# Patient Record
Sex: Female | Born: 1995 | Race: White | Hispanic: No | Marital: Single | Smoking: Never smoker
Health system: Southern US, Community
[De-identification: ages and names within clinical notes are randomized; demographics above are authoritative.]

---

## 2017-09-17 ENCOUNTER — Other Ambulatory Visit: Payer: Self-pay

## 2017-09-17 ENCOUNTER — Emergency Department (HOSPITAL_COMMUNITY): Payer: BLUE CROSS/BLUE SHIELD

## 2017-09-17 ENCOUNTER — Encounter (HOSPITAL_COMMUNITY): Payer: Self-pay | Admitting: Emergency Medicine

## 2017-09-17 ENCOUNTER — Emergency Department (HOSPITAL_COMMUNITY)
Admission: EM | Admit: 2017-09-17 | Discharge: 2017-09-17 | Disposition: A | Discharge status: Home / Self Care | Payer: BLUE CROSS/BLUE SHIELD | Attending: Emergency Medicine | Admitting: Emergency Medicine

## 2017-09-17 DIAGNOSIS — R109 Unspecified abdominal pain: Secondary | ICD-10-CM | POA: Diagnosis present

## 2017-09-17 DIAGNOSIS — N10 Acute tubulo-interstitial nephritis: Secondary | ICD-10-CM | POA: Insufficient documentation

## 2017-09-17 DIAGNOSIS — N12 Tubulo-interstitial nephritis, not specified as acute or chronic: Secondary | ICD-10-CM

## 2017-09-17 LAB — COMPREHENSIVE METABOLIC PANEL
ALK PHOS: 58 U/L (ref 38–126)
ALT: 15 U/L (ref 14–54)
AST: 21 U/L (ref 15–41)
Albumin: 4.1 g/dL (ref 3.5–5.0)
Anion gap: 10 (ref 5–15)
BILIRUBIN TOTAL: 0.5 mg/dL (ref 0.3–1.2)
BUN: 9 mg/dL (ref 6–20)
CALCIUM: 9.3 mg/dL (ref 8.9–10.3)
CO2: 23 mmol/L (ref 22–32)
CREATININE: 0.89 mg/dL (ref 0.44–1.00)
Chloride: 106 mmol/L (ref 101–111)
GFR calc Af Amer: >60 mL/min (ref >60)
GFR calc non Af Amer: >60 mL/min (ref >60)
GLUCOSE: 118 mg/dL — AB (ref 65–99)
Potassium: 3.9 mmol/L (ref 3.5–5.1)
SODIUM: 139 mmol/L (ref 135–145)
TOTAL PROTEIN: 6.8 g/dL (ref 6.5–8.1)

## 2017-09-17 LAB — CBC WITH DIFFERENTIAL/PLATELET
BASOS PCT: 0 %
Basophils Absolute: 0 10*3/uL (ref 0.0–0.1)
EOS ABS: 0.2 10*3/uL (ref 0.0–0.7)
EOS PCT: 1 %
HCT: 39.8 % (ref 36.0–46.0)
Hemoglobin: 13.3 g/dL (ref 12.0–15.0)
Lymphocytes Relative: 22 %
Lymphs Abs: 3 10*3/uL (ref 0.7–4.0)
MCH: 31.6 pg (ref 26.0–34.0)
MCHC: 33.4 g/dL (ref 30.0–36.0)
MCV: 94.5 fL (ref 78.0–100.0)
MONO ABS: 1.6 10*3/uL — AB (ref 0.1–1.0)
MONOS PCT: 12 %
Neutro Abs: 8.6 10*3/uL — ABNORMAL HIGH (ref 1.7–7.7)
Neutrophils Relative %: 65 %
PLATELETS: 359 10*3/uL (ref 150–400)
RBC: 4.21 MIL/uL (ref 3.87–5.11)
RDW: 12.5 % (ref 11.5–15.5)
WBC: 13.4 10*3/uL — ABNORMAL HIGH (ref 4.0–10.5)

## 2017-09-17 LAB — URINALYSIS, ROUTINE W REFLEX MICROSCOPIC
BACTERIA UA: NONE SEEN
Bilirubin Urine: NEGATIVE
Glucose, UA: NEGATIVE mg/dL
Ketones, ur: NEGATIVE mg/dL
NITRITE: POSITIVE — AB
PH: 6 (ref 5.0–8.0)
PROTEIN: 100 mg/dL — AB
SPECIFIC GRAVITY, URINE: 1.025 (ref 1.005–1.030)
Squamous Epithelial / LPF: NONE SEEN

## 2017-09-17 LAB — I-STAT BETA HCG BLOOD, ED (MC, WL, AP ONLY): I-stat hCG, quantitative: <5 m[IU]/mL (ref <5)

## 2017-09-17 MED ORDER — SODIUM CHLORIDE 0.9 % IV BOLUS
1000.0000 mL | Freq: Once | INTRAVENOUS | Status: AC
  Administered 2017-09-17: 1000 mL via INTRAVENOUS

## 2017-09-17 MED ORDER — SODIUM CHLORIDE 0.9 % IV SOLN
1.0000 g | Freq: Once | INTRAVENOUS | Status: AC
  Administered 2017-09-17: 1 g via INTRAVENOUS
  Filled 2017-09-17: qty 10

## 2017-09-17 NOTE — ED Notes (Signed)
Patient transported to CT 

## 2017-09-17 NOTE — Discharge Instructions (Addendum)
°  Please take all of your antibiotics until finished!   You may develop abdominal discomfort or diarrhea from the antibiotic.  You may help offset this with probiotics which you can buy or get in yogurt. Do not eat or take the probiotics until 2 hours after your antibiotic.   Please stay well hydrated by drinking plenty of water.  Have a goal of at least half a liter of water an hour.  May take ibuprofen, naproxen, or Tylenol for pain.  Follow-up with the urologist for recurrent UTIs.  Return to the ED for persistent vomiting, fever that does not respond to medications, worsening pain, generally feeling unwell, or any other major concerns.  Follow-up with the podiatrist regarding your foot infection.

## 2017-09-17 NOTE — ED Provider Notes (Signed)
[facility] EMERGENCY DEPARTMENT Provider Note   CSN: [id number] Arrival date & time: 09/17/17  1940     History   Chief Complaint Chief Complaint  Patient presents with  . Urinary Tract Infection    HPI Jacqueline Elliott is a 22 y.o. female.  HPI   Jacqueline Elliott is a 22 y.o. female, with a history of recurrent UTIs, presenting to the ED with dysuria and right flank.  States she began to have dysuria a couple days ago.  She was seen at an urgent care yesterday, told she had a UTI, and prescribed Cipro 500 mg twice daily for 7 days. Last night she began to have right lower back and flank pain, sharp, 10/10, nonradiating.  Accompanied by fever.   She did note some soft stool following the dose of Cipro last night.  She also notes she was treated for a right big toe infection in [place], where she is from, with two dose of an oral antibiotic called Zinnat (Cefuroxime).  Her last dose of this medication was 2 weeks ago.  Although patient is from [place], she attends [employer].  Denies nausea, vomiting, hematuria, abnormal vaginal discharge or bleeding, or any other complaints.    History reviewed. No pertinent past medical history.  There are no active problems to display for this patient.   History reviewed. No pertinent surgical history.   OB History   None      Home Medications    Prior to Admission medications   Not on File    Family History No family history on file.  Social History Social History   Tobacco Use  . Smoking status: Not on file  Substance Use Topics  . Alcohol use: Not on file  . Drug use: Not on file     Allergies   Penicillins   Review of Systems Review of Systems  Constitutional: Positive for fever.  Respiratory: Negative for shortness of breath.   Cardiovascular: Negative for chest pain.  Gastrointestinal: Negative for blood in stool, nausea and vomiting.  Genitourinary: Positive for dysuria and flank  pain. Negative for hematuria, vaginal bleeding and vaginal discharge.  Musculoskeletal: Positive for back pain.  All other systems reviewed and are negative.    Physical Exam Updated Vital Signs BP 125/88 (BP Location: Right Arm)   Pulse (!) 107   Temp 98.2 F (36.8 C) (Oral)   Resp 16   Ht 5\' 5"  (1.651 m)   Wt 61.2 kg (135 lb)   LMP 09/04/2017 (Approximate)   SpO2 98%   BMI 22.47 kg/m   Physical Exam  Constitutional: She appears well-developed and well-nourished. No distress.  HENT:  Head: Normocephalic and atraumatic.  Eyes: Conjunctivae are normal.  Neck: Neck supple.  Cardiovascular: Normal rate, regular rhythm, normal heart sounds and intact distal pulses.  Pulmonary/Chest: Effort normal and breath sounds normal. No respiratory distress.  Abdominal: Soft. There is no tenderness. There is no guarding and no CVA tenderness.  Musculoskeletal: She exhibits no edema.  Lymphadenopathy:    She has no cervical adenopathy.  Neurological: She is alert.  Skin: Skin is warm and dry. She is not diaphoretic.  Psychiatric: She has a normal mood and affect. Her behavior is normal.  Nursing note and vitals reviewed.    ED Treatments / Results  Labs (all labs ordered are listed, but only abnormal results are displayed) Labs Reviewed  COMPREHENSIVE METABOLIC PANEL - Abnormal; Notable for the following components:  Result Value   Glucose, Bld 118 (*)    All other components within normal limits  CBC WITH DIFFERENTIAL/PLATELET - Abnormal; Notable for the following components:   WBC 13.4 (*)    Neutro Abs 8.6 (*)    Monocytes Absolute 1.6 (*)    All other components within normal limits  URINALYSIS, ROUTINE W REFLEX MICROSCOPIC - Abnormal; Notable for the following components:   APPearance CLOUDY (*)    Hgb urine dipstick LARGE (*)    Protein, ur 100 (*)    Nitrite POSITIVE (*)    Leukocytes, UA LARGE (*)    All other components within normal limits  URINE CULTURE    I-STAT BETA HCG BLOOD, ED ([facility], [facility], [facility] ONLY)    EKG None  Radiology Ct Renal Stone Study  Result Date: 09/17/2017 CLINICAL DATA:  Sharp right flank pain today. Urinary tract infection symptoms since last Tuesday. EXAM: CT ABDOMEN AND PELVIS WITHOUT CONTRAST TECHNIQUE: Multidetector CT imaging of the abdomen and pelvis was performed following the standard protocol without IV contrast. COMPARISON:  None. FINDINGS: Lower chest: No acute abnormality. Hepatobiliary: No focal liver abnormality is seen. No gallstones, gallbladder wall thickening, or biliary dilatation. Pancreas: Unremarkable. No pancreatic ductal dilatation or surrounding inflammatory changes. Spleen: Normal in size without focal abnormality. Adrenals/Urinary Tract: The adrenal glands are normal. There is right hydroureteronephrosis without focal discrete obstructing stone noted. No renal stones are identified bilaterally. There is no left hydronephrosis. The bladder is normal. Stomach/Bowel: Stomach is within normal limits. Appendix appears normal. No evidence of bowel wall thickening, distention, or inflammatory changes. Vascular/Lymphatic: No significant vascular findings are present. No enlarged abdominal or pelvic lymph nodes. Reproductive: Uterus and bilateral adnexa are unremarkable. Other: None. Musculoskeletal: No acute or significant osseous findings. IMPRESSION: Right hydroureteronephrosis without focal discrete obstructing stone noted. The findings can be seen in pyelonephritis or recently passed stone. Normal appendix. Electronically Signed   By: Sherian Rein M.D.   On: 09/17/2017 22:17    Procedures Procedures (including critical care time)  Medications Ordered in ED Medications  sodium chloride 0.9 % bolus 1,000 mL (0 mLs Intravenous Stopped 09/17/17 2232)  cefTRIAXone (ROCEPHIN) 1 g in sodium chloride 0.9 % 100 mL IVPB (0 g Intravenous Stopped 09/17/17 2220)     Initial Impression / Assessment and Plan / ED Course  I  have reviewed the triage vital signs and the nursing notes.  Pertinent labs & imaging results that were available during my care of the patient were reviewed by me and considered in my medical decision making (see chart for details).  Clinical Course as of Sep 17 2241  Wynelle Link Sep 17, 2017  2226 Discussed imaging results with the patient.  Patient continues to be nontoxic-appearing.  No complaints of pain.   [SJ]    Clinical Course User Index [SJ] Corban Kistler C, PA-C    Patient presents with dysuria and right flank pain.  Patient is nontoxic appearing, afebrile, not tachycardic on my exam, not tachypneic, not hypotensive, and is in no apparent distress.   Pyelonephritis is high on the differential.  UA results are also consistent with pyelonephritis.  Due to sudden onset of symptoms, however, rule out of obstructing stone with infection is indicated. Patient remained comfortable throughout ED course.  Tolerating PO.  No evidence of renal stone on CT.  Patient's course of Cipro that was prescribed yesterday is an appropriate choice for pyelonephritis therefore no additional antibiotic is needed at this time.  Referral to urology  due to recurrent UTIs.  The patient was given instructions for home care as well as return precautions. Patient voices understanding of these instructions, accepts the plan, and is comfortable with discharge.  Vitals:   09/17/17 1945 09/17/17 1957 09/17/17 2235  BP: 125/88  125/89  Pulse: (!) 107  90  Resp: 16  16  Temp: 98.2 F (36.8 C)  98.5 F (36.9 C)  TempSrc: Oral  Oral  SpO2: 98%  99%  Weight: 61.2 kg (135 lb) 61.2 kg (135 lb)   Height: 5\' 5"  (1.651 m) 5\' 5"  (1.651 m)      Final Clinical Impressions(s) / ED Diagnoses   Final diagnoses:  Pyelonephritis    ED Discharge Orders    None       Concepcion Living 09/17/17 2243    Tilden Fossa, MD 09/18/17 1203

## 2017-09-17 NOTE — ED Notes (Signed)
ED Provider at bedside. 

## 2017-09-17 NOTE — ED Triage Notes (Signed)
Pt went to the Dr. Today for symptoms of a UTI (frequently has UTIs) and was given antibiotics.  Pt went home and took a nap, woke up w/ "stabbing" flank pain and a fever.  Pt reports taking tylenol 4 hours ago.

## 2017-09-18 ENCOUNTER — Ambulatory Visit: Payer: BLUE CROSS/BLUE SHIELD | Admitting: Podiatry

## 2017-09-18 ENCOUNTER — Encounter: Payer: Self-pay | Admitting: Podiatry

## 2017-09-18 DIAGNOSIS — L600 Ingrowing nail: Secondary | ICD-10-CM | POA: Diagnosis not present

## 2017-09-18 DIAGNOSIS — L03032 Cellulitis of left toe: Secondary | ICD-10-CM

## 2017-09-18 NOTE — Patient Instructions (Signed)

## 2017-09-18 NOTE — Progress Notes (Signed)
This patient presents to the office with chief complaint of a painful infected ingrown toenail left big toe.  Patient states it has been ingrown and infected for 2 months.  This patient comes from [place].  She says when she was home. She took an antibiotic named zinnat for the infection in her left great toe.  She says that was not helping her nail.  2 days ago. She went to the emergency room and was diagnosed with a UTI.  She was prescribed Cipro which she has been taking now for 2 days.  She says the medical doctor told her that would help her infected ingrown toenail.  She presents the office today stating that she has a painful infected toenail and desires an evaluation and treatment.   General Appearance  Alert, conversant and in no acute stress.  Vascular  Dorsalis pedis and posterior tibial  pulses are palpable  bilaterally.  Capillary return is within normal limits  bilaterally. Temperature is within normal limits  bilaterally.  Neurologic  Senn-Weinstein monofilament wire test within normal limits  bilaterally. Muscle power within normal limits bilaterally.  Nails marked incurvation noted along the lateral border of the left great toenail.  There is redness, swelling and pain with granulation tissue noted along the lateral border.  Orthopedic  No limitations of motion of motion feet .  No crepitus or effusions noted.  No bony pathology or digital deformities noted.  Skin  normotropic skin with no porokeratosis noted bilaterally.  No signs of infections or ulcers noted.   Ingrown toenail left hallux.  Paronychia left hallux.   IE  Nail surgery.  Treatment options and alternatives discussed.  Recommended permanent phenol matrixectomy and patient agreed. The left hallux was prepped with alcohol and a toe block of 3cc of 2% lidocaine plain was administered in a digital toe block. .  The toe was then prepped with betadine solution .  The offending nail border was then excised and matrix tissue  exposed.  Phenol was then applied to the matrix tissue followed by an alcohol wash.  Antibiotic ointment and a dry sterile dressing was applied.  The patient was dispensed instructions for aftercare. I had considered ordering cephalexin for this patient.  She was found to be allergic to penicillin.  Therefore, I told her to take the antibiotic Cipro which she is taking for her UTI for her infected great toe.  Home soaks were prescribed for this patient.  Patient to return to the office in one week for further evaluation and treatment     Jacqueline GuntherGregory Chaniyah Elliott DPM

## 2017-09-19 LAB — URINE CULTURE

## 2017-09-21 ENCOUNTER — Ambulatory Visit: Payer: BLUE CROSS/BLUE SHIELD | Admitting: Podiatry

## 2017-09-28 ENCOUNTER — Ambulatory Visit (INDEPENDENT_AMBULATORY_CARE_PROVIDER_SITE_OTHER): Payer: BLUE CROSS/BLUE SHIELD | Admitting: Podiatry

## 2017-09-28 ENCOUNTER — Encounter: Payer: Self-pay | Admitting: Podiatry

## 2017-09-28 DIAGNOSIS — Z09 Encounter for follow-up examination after completed treatment for conditions other than malignant neoplasm: Secondary | ICD-10-CM

## 2017-09-28 NOTE — Progress Notes (Signed)
This patient returns to the office following nail surgery one week ago.  The patient says toe has been soaked and bandaged as directed.  There has been improvement of the toe since the surgery has been performed. The patient presents for continued evaluation and treatment.  GENERAL APPEARANCE: Alert, conversant. Appropriately groomed. No acute distress.  VASCULAR: Pedal pulses palpable at  DP and PT bilateral.  Capillary refill time is immediate to all digits,  Normal temperature gradient.    NEUROLOGIC: sensation is normal to 5.07 monofilament at 5/5 sites bilateral.  Light touch is intact bilateral, Muscle strength normal.  MUSCULOSKELETAL: acceptable muscle strength, tone and stability bilateral.  Intrinsic muscluature intact bilateral.  Rectus appearance of foot and digits noted bilateral.   DERMATOLOGIC: skin color, texture, and turgor are within normal limits.  No preulcerative lesions or ulcers  are seen, no interdigital maceration noted.   NAILS  There is necrotic tissue along the nail groove  In the absence of redness swelling and pain.  DX  S/p nail surgery  ROV  Home instructions were discussed.  Patient to call the office if there are any questions or concerns.   Lynnetta Tom DPM   

## 2017-10-19 ENCOUNTER — Ambulatory Visit: Admit: 2017-10-19 | Payer: BLUE CROSS/BLUE SHIELD

## 2017-10-24 ENCOUNTER — Ambulatory Visit
Admission: RE | Admit: 2017-10-24 | Discharge: 2017-10-24 | Disposition: A | Discharge status: Home / Self Care | Payer: BLUE CROSS/BLUE SHIELD | Source: Ambulatory Visit | Attending: Family Medicine | Admitting: Family Medicine

## 2017-10-24 ENCOUNTER — Other Ambulatory Visit: Payer: Self-pay | Admitting: Family Medicine

## 2017-10-24 DIAGNOSIS — Z7184 Encounter for health counseling related to travel: Secondary | ICD-10-CM

## 2017-10-24 DIAGNOSIS — Z0289 Encounter for other administrative examinations: Secondary | ICD-10-CM | POA: Insufficient documentation

## 2018-08-19 IMAGING — CT CT RENAL STONE PROTOCOL
2 of 4 series · 16 of 46 positions shown, 18 images · non-contrast
Comparison: None.

CLINICAL DATA: Sharp right flank pain today. Urinary tract
infection symptoms since last [REDACTED].

EXAM:
CT ABDOMEN AND PELVIS WITHOUT CONTRAST
TECHNIQUE: Multidetector CT imaging of the abdomen and pelvis was performed
following the standard protocol without IV contrast.

[Series 3: stone study 5.0 i30f 2 · axial · 0.79mm/px · z∈[-391,+29]mm · 13 of 92 slices shown, 15 images]
[im 4/92  soft-tissue]
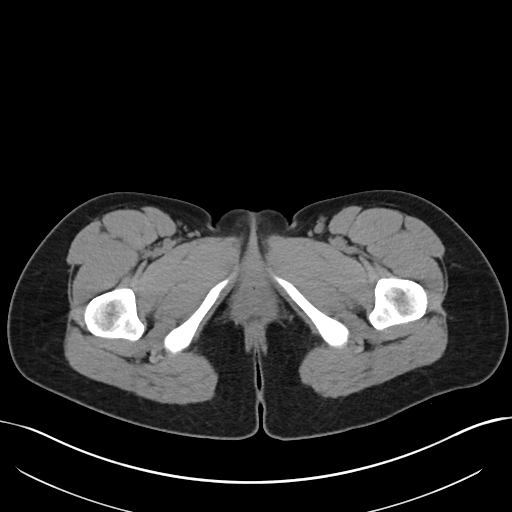
[im 4/92  bone]
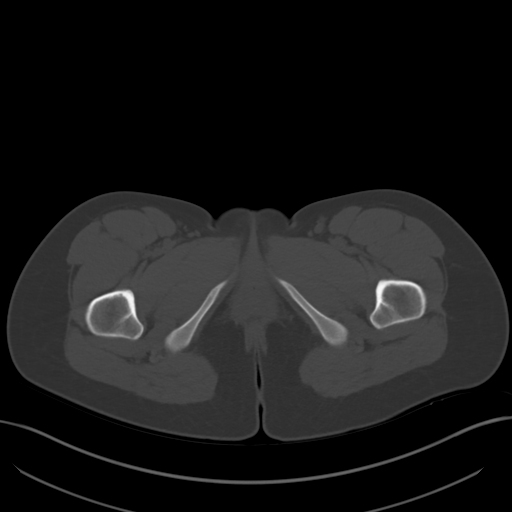
[im 11/92  soft-tissue]
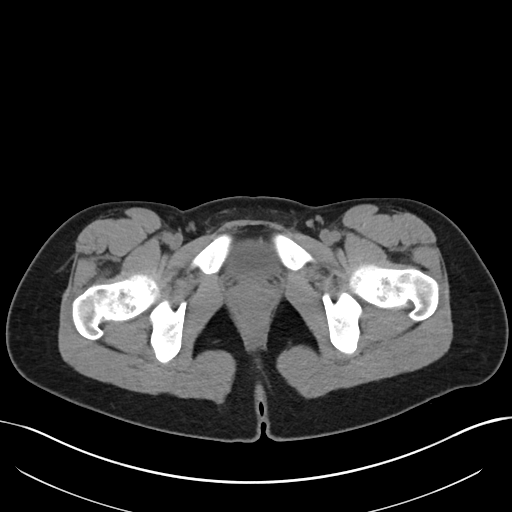
[im 18/92  soft-tissue]
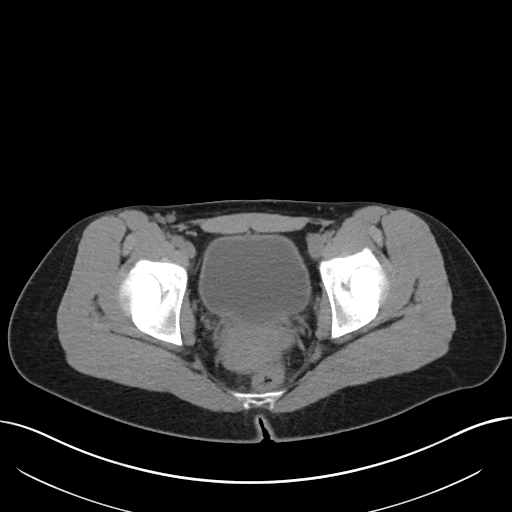
[im 25/92  soft-tissue]
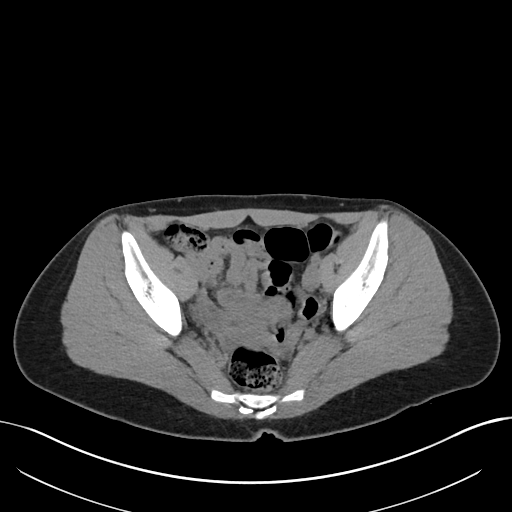
[im 32/92  soft-tissue]
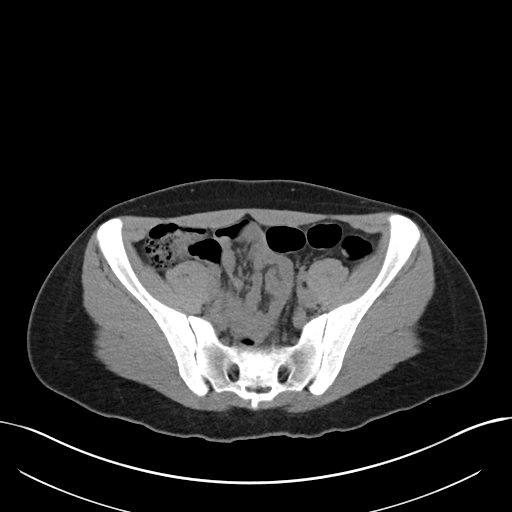
[im 39/92  soft-tissue]
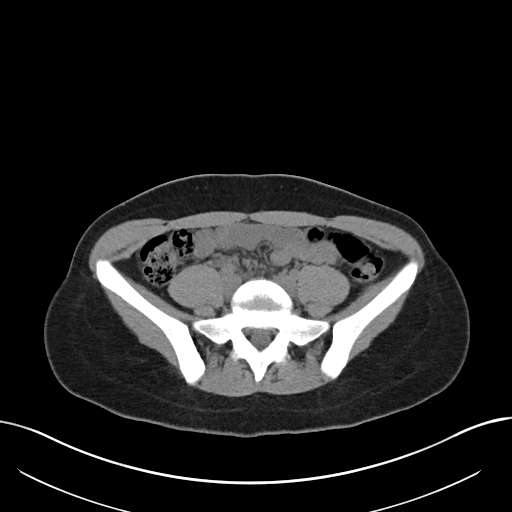
[im 46/92  soft-tissue]
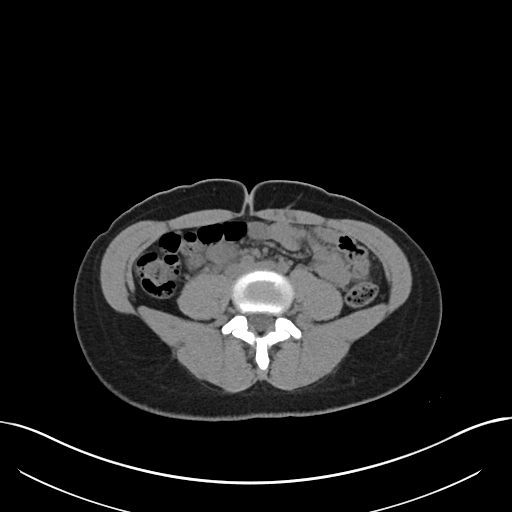
[im 53/92  soft-tissue]
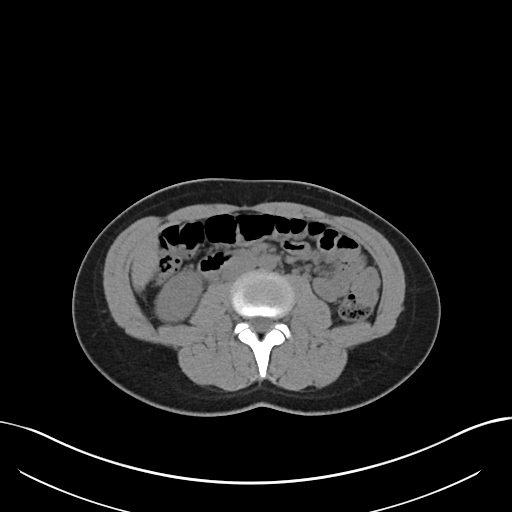
[im 60/92  soft-tissue]
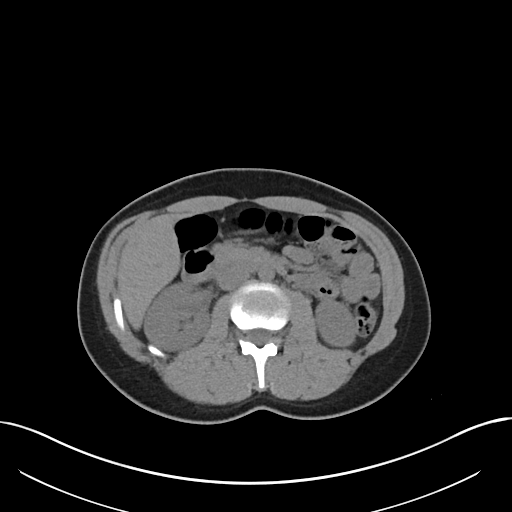
[im 60/92  bone]
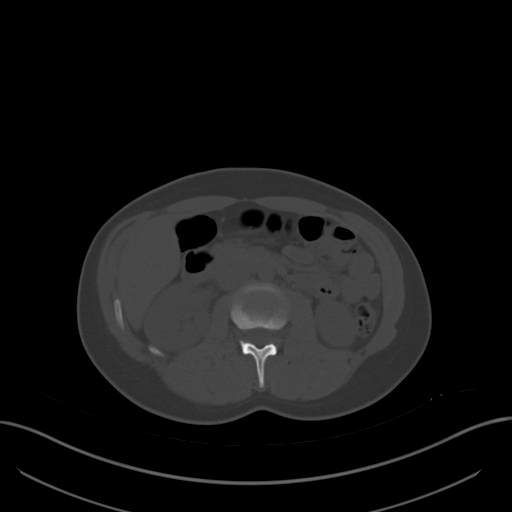
[im 67/92  soft-tissue]
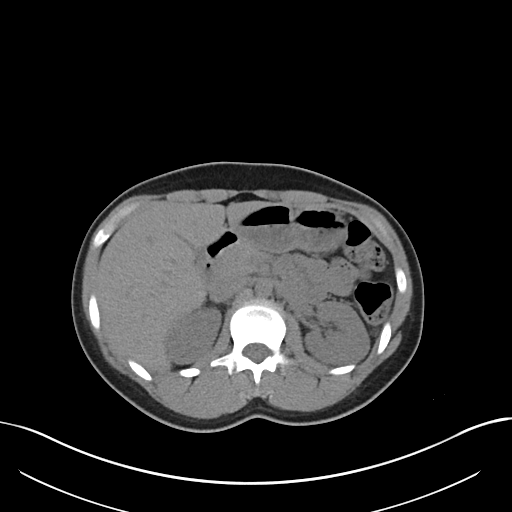
[im 74/92  soft-tissue]
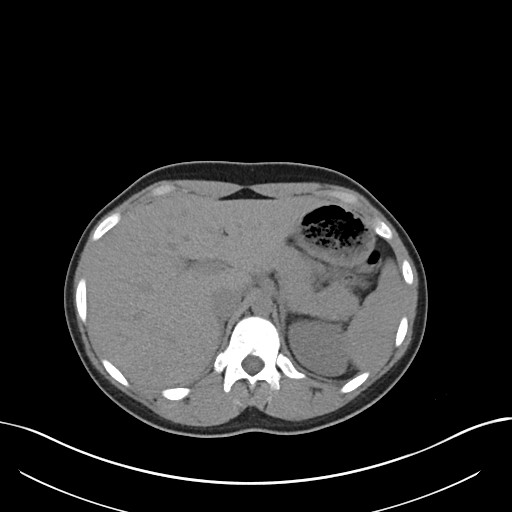
[im 81/92  soft-tissue]
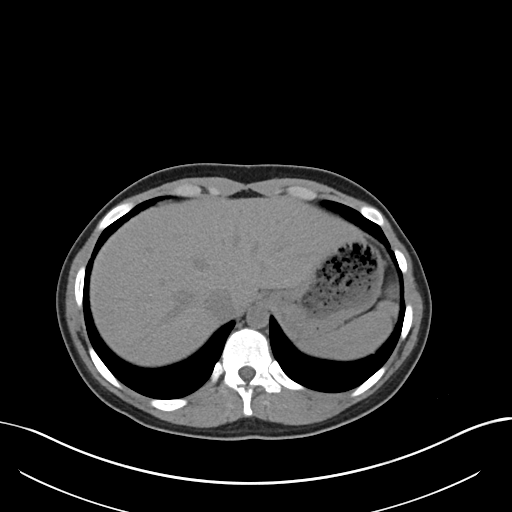
[im 88/92  soft-tissue]
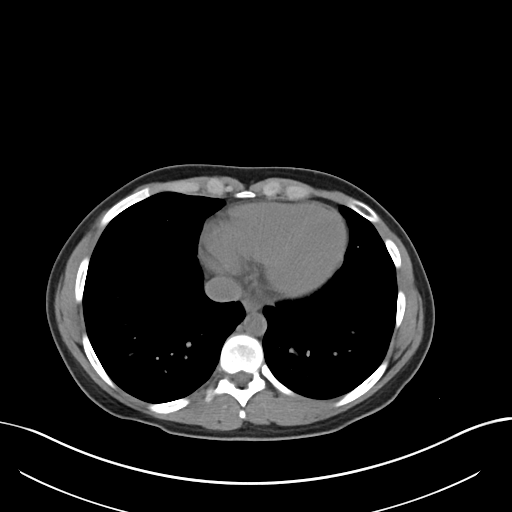

[Series 6: coronal soft tissue · coronal · 0.65mm/px · 3 of 101 slices shown]
[im 34/101  soft-tissue]
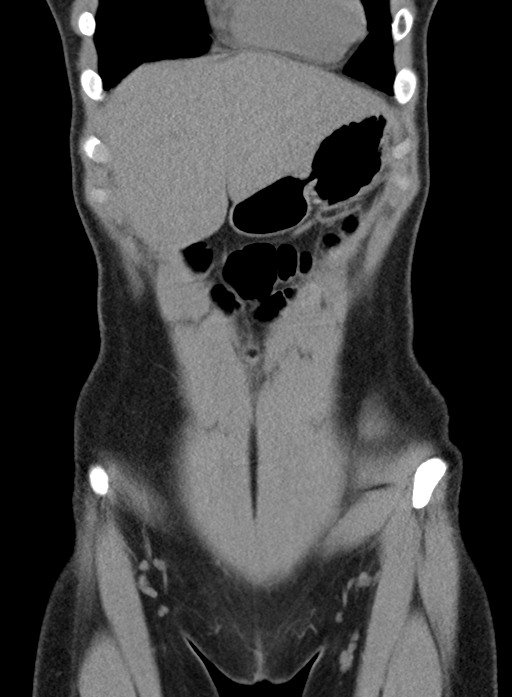
[im 45/101  soft-tissue]
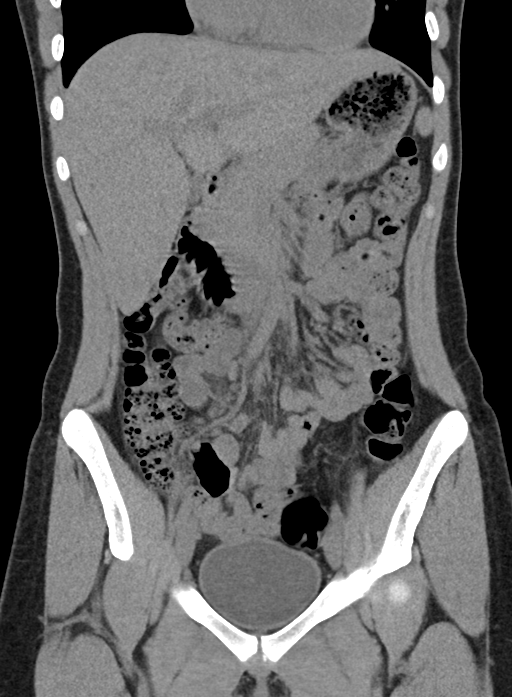
[im 56/101  soft-tissue]
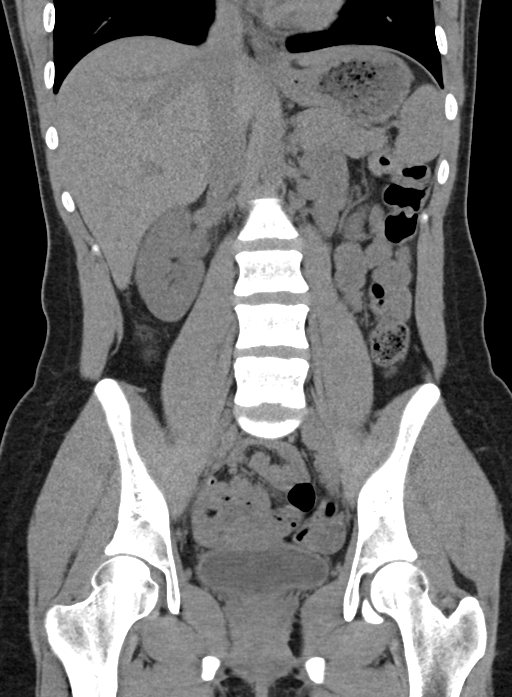

[16 of 46 positions shown; findings below may reference images not displayed]

FINDINGS: Lower chest: No acute abnormality.

Hepatobiliary: No focal liver abnormality is seen. No gallstones,
gallbladder wall thickening, or biliary dilatation.

Pancreas: Unremarkable. No pancreatic ductal dilatation or
surrounding inflammatory changes.

Spleen: Normal in size without focal abnormality.

Adrenals/Urinary Tract: The adrenal glands are normal. There is
right hydroureteronephrosis without focal discrete obstructing stone
noted. No renal stones are identified bilaterally. There is no left
hydronephrosis. The bladder is normal.

Stomach/Bowel: Stomach is within normal limits. Appendix appears
normal. No evidence of bowel wall thickening, distention, or
inflammatory changes.

Vascular/Lymphatic: No significant vascular findings are present. No
enlarged abdominal or pelvic lymph nodes.

Reproductive: Uterus and bilateral adnexa are unremarkable.

Other: None.

Musculoskeletal: No acute or significant osseous findings.
IMPRESSION: Right hydroureteronephrosis without focal discrete obstructing stone
noted. The findings can be seen in pyelonephritis or recently passed
stone.

Normal appendix.

## 2018-09-25 IMAGING — CR DG CHEST 2V
1 series · 2 of 2 positions shown · non-contrast
Comparison: None.

CLINICAL DATA: No chest complaints, x-ray needed for [place]
visa

EXAM:
CHEST - 2 VIEW

[Series 1: dg chest 2 view · 0.14mm/px · 2 of 2 slices shown]
[im 1/2]
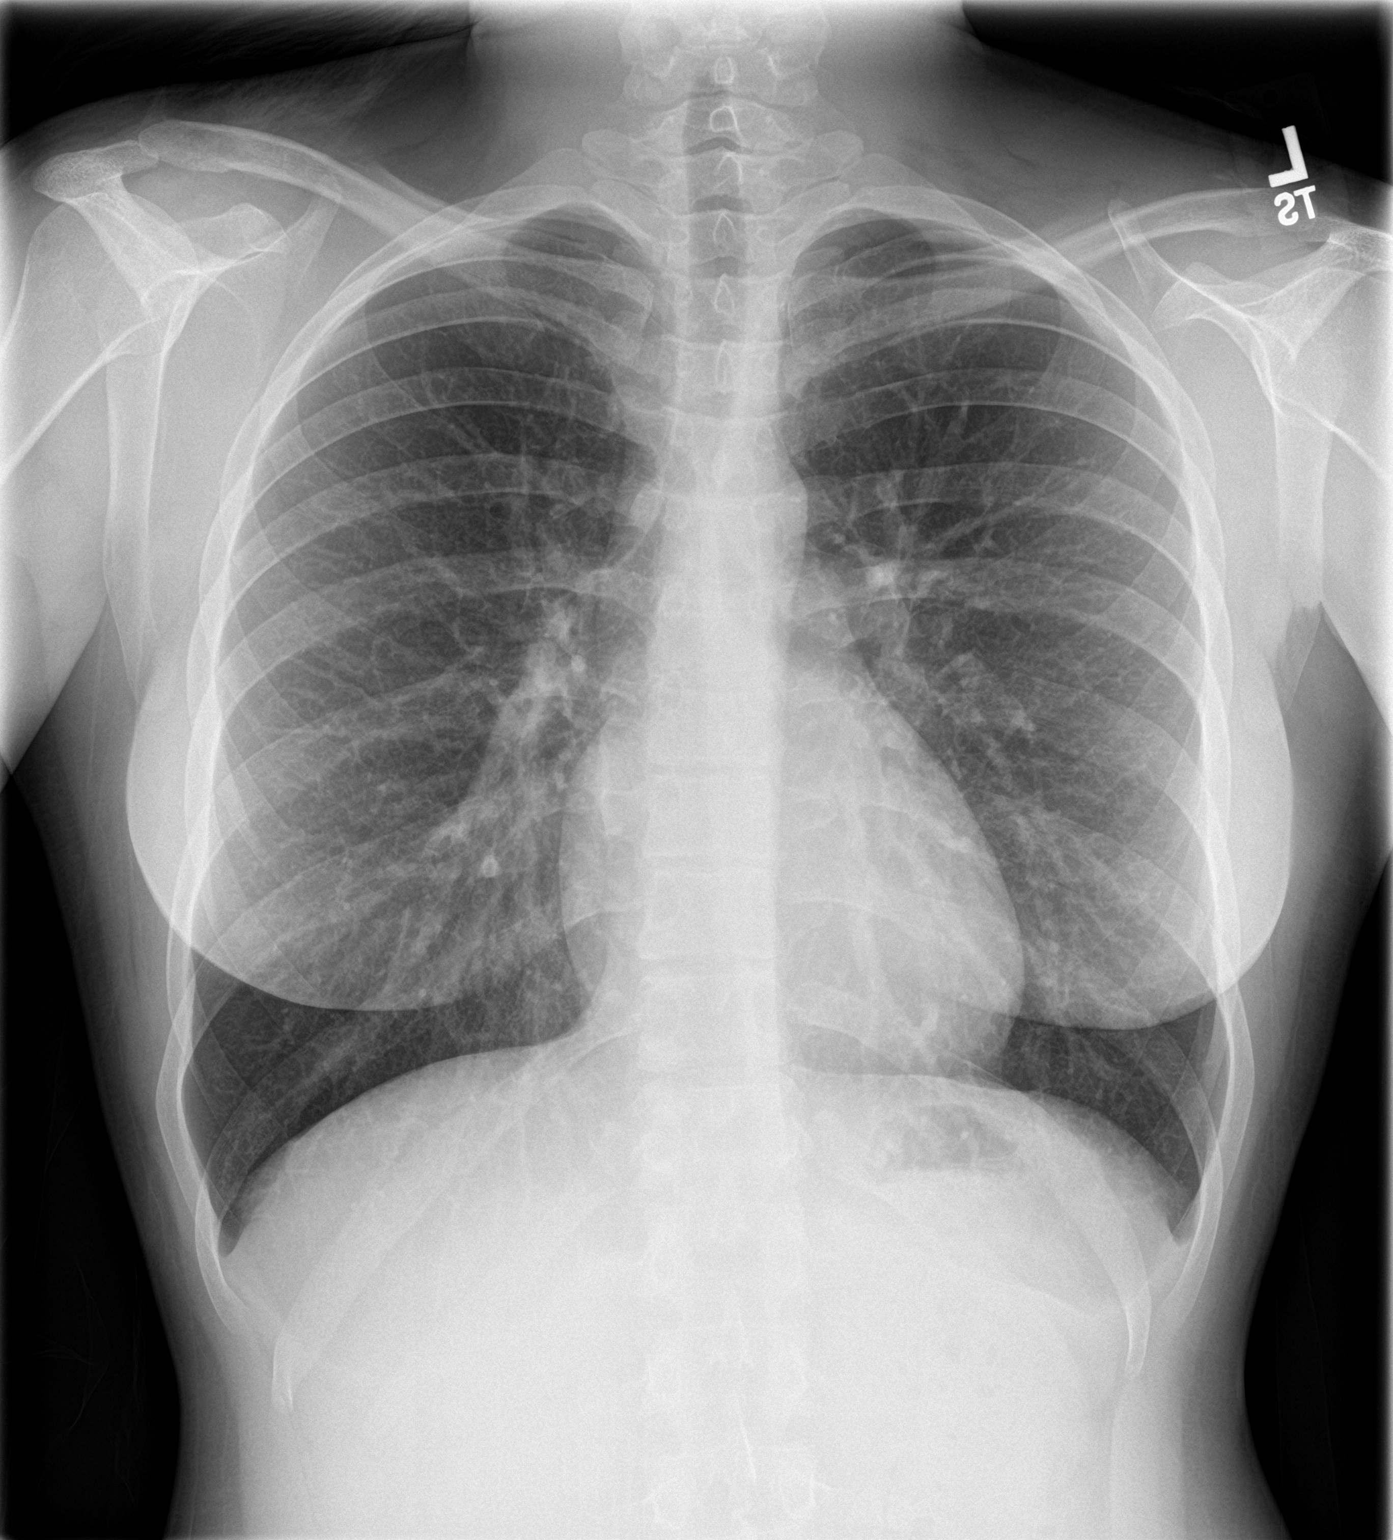
[im 2/2]
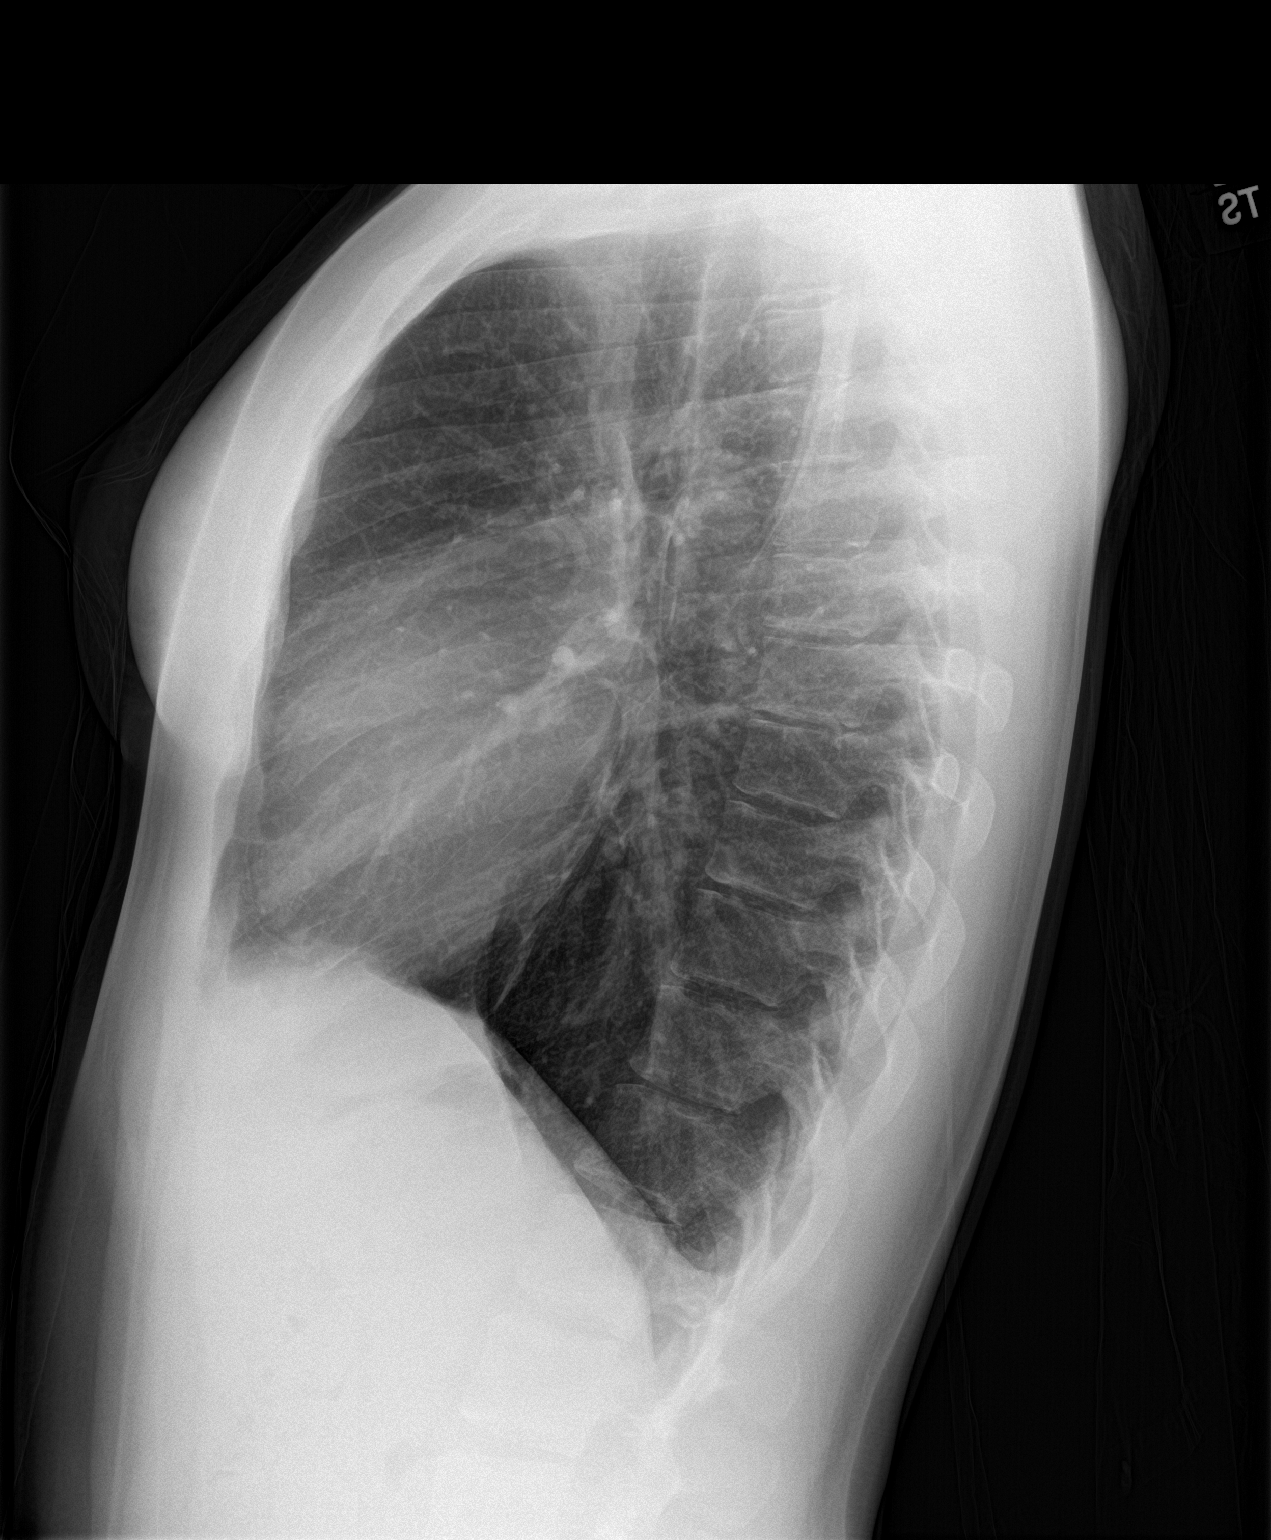

[2 of 2 positions shown; findings below may reference images not displayed]

FINDINGS: The heart size and mediastinal contours are within normal limits.
Both lungs are clear. The visualized skeletal structures are
unremarkable.
IMPRESSION: No active cardiopulmonary disease.

## 2019-09-09 ENCOUNTER — Ambulatory Visit: Payer: BLUE CROSS/BLUE SHIELD | Attending: Internal Medicine

## 2019-09-09 DIAGNOSIS — Z23 Encounter for immunization: Secondary | ICD-10-CM

## 2019-09-09 NOTE — Progress Notes (Signed)
   Covid-19 Vaccination Clinic  Name:  [name]    MRN: [id number] DOB: 18-Aug-1995  09/09/2019  Ms. Tomaselli was observed post Covid-19 immunization for 15 minutes without incident. She was provided with Vaccine Information Sheet and instruction to access the V-Safe system.   Ms. Shilling was instructed to call 911 with any severe reactions post vaccine: Marland Kitchen Difficulty breathing  . Swelling of face and throat  . A fast heartbeat  . A bad rash all over body  . Dizziness and weakness   Immunizations Administered    Name Date Dose VIS Date Route   Pfizer COVID-19 Vaccine 09/09/2019  9:56 AM 0.3 mL 05/24/2019 Intramuscular   Manufacturer: [employer], [employer]   Lot: [id number]   NDC: [address]-4

## 2019-09-09 NOTE — Progress Notes (Signed)
.  covidnote

## 2019-09-30 ENCOUNTER — Ambulatory Visit: Payer: BLUE CROSS/BLUE SHIELD | Attending: Internal Medicine

## 2019-09-30 DIAGNOSIS — Z23 Encounter for immunization: Secondary | ICD-10-CM

## 2019-09-30 NOTE — Progress Notes (Signed)
   Covid-19 Vaccination Clinic  Name:  Jacqueline Elliott    MRN: [id number] DOB: 14-Sep-1995  09/30/2019  [name] was observed post Covid-19 immunization for 15 minutes without incident. She was provided with Vaccine Information Sheet and instruction to access the V-Safe system.   [name] was instructed to call 911 with any severe reactions post vaccine: Marland Kitchen Difficulty breathing  . Swelling of face and throat  . A fast heartbeat  . A bad rash all over body  . Dizziness and weakness   Immunizations Administered    Name Date Dose VIS Date Route   Pfizer COVID-19 Vaccine 09/30/2019  9:44 AM 0.3 mL 08/07/2018 Intramuscular   Manufacturer: [employer], [employer]   Lot: [id number]   NDC: [address]-2
